# Patient Record
Sex: Male | Born: 1990 | Race: Black or African American | Hispanic: No | Marital: Single | Smoking: Current every day smoker
Health system: Southern US, Community
[De-identification: ages and names within clinical notes are randomized; demographics above are authoritative.]

---

## 2014-11-28 ENCOUNTER — Emergency Department (HOSPITAL_COMMUNITY)
Admission: EM | Admit: 2014-11-28 | Discharge: 2014-11-28 | Disposition: A | Discharge status: Home / Self Care | Payer: No Typology Code available for payment source | Attending: Emergency Medicine | Admitting: Emergency Medicine

## 2014-11-28 ENCOUNTER — Emergency Department (HOSPITAL_COMMUNITY): Payer: No Typology Code available for payment source

## 2014-11-28 ENCOUNTER — Encounter (HOSPITAL_COMMUNITY): Payer: Self-pay | Admitting: *Deleted

## 2014-11-28 DIAGNOSIS — Z720 Tobacco use: Secondary | ICD-10-CM | POA: Diagnosis not present

## 2014-11-28 DIAGNOSIS — Y9389 Activity, other specified: Secondary | ICD-10-CM | POA: Diagnosis not present

## 2014-11-28 DIAGNOSIS — S4991XA Unspecified injury of right shoulder and upper arm, initial encounter: Secondary | ICD-10-CM

## 2014-11-28 DIAGNOSIS — Y92410 Unspecified street and highway as the place of occurrence of the external cause: Secondary | ICD-10-CM | POA: Diagnosis not present

## 2014-11-28 DIAGNOSIS — S199XXA Unspecified injury of neck, initial encounter: Secondary | ICD-10-CM | POA: Insufficient documentation

## 2014-11-28 DIAGNOSIS — Y998 Other external cause status: Secondary | ICD-10-CM | POA: Diagnosis not present

## 2014-11-28 MED ORDER — CYCLOBENZAPRINE HCL 10 MG PO TABS: 10.0000 mg | ORAL_TABLET | Freq: Two times a day (BID) | ORAL | Status: AC | PRN

## 2014-11-28 MED ORDER — IBUPROFEN 600 MG PO TABS: 600.0000 mg | ORAL_TABLET | Freq: Four times a day (QID) | ORAL | Status: AC | PRN
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
## 2014-12-22 ENCOUNTER — Ambulatory Visit (HOSPITAL_COMMUNITY)
Admission: RE | Admit: 2014-12-22 | Discharge: 2014-12-22 | Disposition: A | Discharge status: Home / Self Care | Payer: No Typology Code available for payment source | Source: Ambulatory Visit | Attending: Chiropractic Medicine | Admitting: Chiropractic Medicine

## 2014-12-22 ENCOUNTER — Other Ambulatory Visit (HOSPITAL_COMMUNITY): Payer: Self-pay | Admitting: Chiropractic Medicine

## 2014-12-22 DIAGNOSIS — M4184 Other forms of scoliosis, thoracic region: Secondary | ICD-10-CM | POA: Insufficient documentation

## 2014-12-22 DIAGNOSIS — M4807 Spinal stenosis, lumbosacral region: Secondary | ICD-10-CM | POA: Insufficient documentation

## 2014-12-22 DIAGNOSIS — M542 Cervicalgia: Secondary | ICD-10-CM

## 2014-12-22 DIAGNOSIS — M545 Low back pain: Secondary | ICD-10-CM | POA: Diagnosis present
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
